# Patient Record
Sex: Male | Born: 1950 | Race: White | Hispanic: No | Marital: Married | Smoking: Former smoker
Health system: Southern US, Community
[De-identification: ages and names within clinical notes are randomized; demographics above are authoritative.]

## PROBLEM LIST (undated history)

## (undated) DIAGNOSIS — G4733 Obstructive sleep apnea (adult) (pediatric): Secondary | ICD-10-CM

## (undated) DIAGNOSIS — D689 Coagulation defect, unspecified: Secondary | ICD-10-CM

## (undated) DIAGNOSIS — M79609 Pain in unspecified limb: Secondary | ICD-10-CM

## (undated) DIAGNOSIS — E785 Hyperlipidemia, unspecified: Secondary | ICD-10-CM

## (undated) DIAGNOSIS — K648 Other hemorrhoids: Secondary | ICD-10-CM

## (undated) DIAGNOSIS — R569 Unspecified convulsions: Secondary | ICD-10-CM

## (undated) DIAGNOSIS — G40109 Localization-related (focal) (partial) symptomatic epilepsy and epileptic syndromes with simple partial seizures, not intractable, without status epilepticus: Secondary | ICD-10-CM

## (undated) DIAGNOSIS — H409 Unspecified glaucoma: Secondary | ICD-10-CM

## (undated) DIAGNOSIS — I639 Cerebral infarction, unspecified: Secondary | ICD-10-CM

## (undated) DIAGNOSIS — I809 Phlebitis and thrombophlebitis of unspecified site: Secondary | ICD-10-CM

## (undated) DIAGNOSIS — K5790 Diverticulosis of intestine, part unspecified, without perforation or abscess without bleeding: Secondary | ICD-10-CM

## (undated) HISTORY — DX: Unspecified glaucoma: H40.9

## (undated) HISTORY — DX: Other hemorrhoids: K64.8

## (undated) HISTORY — DX: Localization-related (focal) (partial) symptomatic epilepsy and epileptic syndromes with simple partial seizures, not intractable, without status epilepticus: G40.109

## (undated) HISTORY — DX: Obstructive sleep apnea (adult) (pediatric): G47.33

## (undated) HISTORY — PX: KNEE ARTHROCENTESIS: SUR44

## (undated) HISTORY — DX: Coagulation defect, unspecified: D68.9

## (undated) HISTORY — PX: CATARACT EXTRACTION: SUR2

## (undated) HISTORY — DX: Hyperlipidemia, unspecified: E78.5

## (undated) HISTORY — DX: Phlebitis and thrombophlebitis of unspecified site: I80.9

## (undated) HISTORY — DX: Diverticulosis of intestine, part unspecified, without perforation or abscess without bleeding: K57.90

## (undated) HISTORY — PX: GLAUCOMA SURGERY: SHX656

## (undated) HISTORY — DX: Pain in unspecified limb: M79.609

---

## 2004-08-13 ENCOUNTER — Ambulatory Visit (HOSPITAL_COMMUNITY): Admission: RE | Admit: 2004-08-13 | Discharge: 2004-08-13 | Payer: Self-pay | Admitting: Orthopedic Surgery

## 2004-09-07 ENCOUNTER — Ambulatory Visit: Payer: Self-pay | Admitting: Oncology

## 2006-03-01 ENCOUNTER — Encounter: Admission: RE | Admit: 2006-03-01 | Discharge: 2006-03-01 | Payer: Self-pay | Admitting: Family Medicine

## 2009-03-09 ENCOUNTER — Encounter: Admission: RE | Admit: 2009-03-09 | Discharge: 2009-03-09 | Payer: Self-pay | Admitting: Internal Medicine

## 2010-12-27 ENCOUNTER — Emergency Department (HOSPITAL_COMMUNITY): Payer: Managed Care, Other (non HMO)

## 2010-12-27 ENCOUNTER — Inpatient Hospital Stay (HOSPITAL_COMMUNITY)
Admission: EM | Admit: 2010-12-27 | Discharge: 2010-12-28 | DRG: 101 | Disposition: A | Discharge status: Home / Self Care | Payer: Managed Care, Other (non HMO) | Attending: Neurology | Admitting: Neurology

## 2010-12-27 DIAGNOSIS — Z7902 Long term (current) use of antithrombotics/antiplatelets: Secondary | ICD-10-CM

## 2010-12-27 DIAGNOSIS — F1010 Alcohol abuse, uncomplicated: Secondary | ICD-10-CM | POA: Diagnosis present

## 2010-12-27 DIAGNOSIS — Z79899 Other long term (current) drug therapy: Secondary | ICD-10-CM

## 2010-12-27 DIAGNOSIS — I10 Essential (primary) hypertension: Secondary | ICD-10-CM | POA: Diagnosis present

## 2010-12-27 DIAGNOSIS — Z7982 Long term (current) use of aspirin: Secondary | ICD-10-CM

## 2010-12-27 DIAGNOSIS — G40802 Other epilepsy, not intractable, without status epilepticus: Principal | ICD-10-CM | POA: Diagnosis present

## 2010-12-27 DIAGNOSIS — H409 Unspecified glaucoma: Secondary | ICD-10-CM | POA: Diagnosis present

## 2010-12-27 DIAGNOSIS — Z8673 Personal history of transient ischemic attack (TIA), and cerebral infarction without residual deficits: Secondary | ICD-10-CM

## 2010-12-27 LAB — COMPREHENSIVE METABOLIC PANEL
Alkaline Phosphatase: 73 U/L (ref 39–117)
BUN: 14 mg/dL (ref 6–23)
Glucose, Bld: 150 mg/dL — ABNORMAL HIGH (ref 70–99)
Potassium: 4.8 mEq/L (ref 3.5–5.1)
Total Protein: 7.7 g/dL (ref 6.0–8.3)

## 2010-12-27 LAB — CBC
HCT: 47.2 % (ref 39.0–52.0)
Hemoglobin: 16.1 g/dL (ref 13.0–17.0)
RBC: 4.87 MIL/uL (ref 4.22–5.81)
RDW: 12.6 % (ref 11.5–15.5)
WBC: 7.6 10*3/uL (ref 4.0–10.5)

## 2010-12-27 LAB — URINE MICROSCOPIC-ADD ON

## 2010-12-27 LAB — DIFFERENTIAL
Eosinophils Relative: 2 % (ref 0–5)
Lymphocytes Relative: 40 % (ref 12–46)
Monocytes Relative: 11 % (ref 3–12)
Neutrophils Relative %: 46 % (ref 43–77)

## 2010-12-27 LAB — URINALYSIS, ROUTINE W REFLEX MICROSCOPIC
Bilirubin Urine: NEGATIVE
Glucose, UA: NEGATIVE mg/dL
Ketones, ur: NEGATIVE mg/dL
Leukocytes, UA: NEGATIVE
Nitrite: NEGATIVE
Protein, ur: 30 mg/dL — AB
Specific Gravity, Urine: 1.017 (ref 1.005–1.030)
Urobilinogen, UA: 0.2 mg/dL (ref 0.0–1.0)
pH: 5 (ref 5.0–8.0)

## 2010-12-27 LAB — PHENYTOIN LEVEL, TOTAL: Phenytoin Lvl: 10.9 ug/mL (ref 10.0–20.0)

## 2010-12-27 LAB — CK TOTAL AND CKMB (NOT AT ARMC)
CK, MB: 2.5 ng/mL (ref 0.3–4.0)
Relative Index: 1.7 (ref 0.0–2.5)
Total CK: 148 U/L (ref 7–232)

## 2010-12-27 LAB — PROTIME-INR
INR: 0.99 (ref 0.00–1.49)
Prothrombin Time: 13.3 seconds (ref 11.6–15.2)

## 2010-12-27 LAB — APTT: aPTT: 26 seconds (ref 24–37)

## 2010-12-27 LAB — MRSA PCR SCREENING: MRSA by PCR: NEGATIVE

## 2010-12-27 MED ORDER — GADOBENATE DIMEGLUMINE 529 MG/ML IV SOLN
20.0000 mL | Freq: Once | INTRAVENOUS | Status: AC
  Administered 2010-12-27: 20 mL via INTRAVENOUS

## 2010-12-28 ENCOUNTER — Other Ambulatory Visit (HOSPITAL_COMMUNITY): Payer: Managed Care, Other (non HMO)

## 2010-12-28 LAB — LIPID PANEL
Cholesterol: 137 mg/dL (ref 0–200)
HDL: 50 mg/dL (ref >39)
Total CHOL/HDL Ratio: 2.7 RATIO
Triglycerides: 74 mg/dL (ref <150)
VLDL: 15 mg/dL (ref 0–40)

## 2010-12-28 LAB — URINE CULTURE

## 2011-01-07 NOTE — H&P (Signed)
NAMEJANDEL, Jason Preston NO.:  [id number]  MEDICAL RECORD NO.:  [id number]           PATIENT TYPE:  I  LOCATION:  3111                         FACILITY:  [facility]  PHYSICIAN:  Thana Farr, MD    DATE OF BIRTH:  02-04-1951  DATE OF ADMISSION:  12/27/2010 DATE OF DISCHARGE:                             HISTORY & PHYSICAL   ADMISSION DIAGNOSIS:  Status epilepticus.  HISTORY:  Jason Preston is a 60 year old male that was recently discharged from the hospital in April with after having had a stroke and new-onset seizure.  Antiepileptics were not started at that time.  The patient did not have any deficit.  He was playing tennis today and was noted to have a full focal seizure with head twitching to the left.  The patient walked off of the court and had another set of seizure.  After he was noted to have left-sided weakness.  EMS was called and during the EMS arrived, the patient had a what was felt to be a generalized tonic- clonic seizure.  During evaluation here in the ED, the patient had another witnessed seizure.  Head and eyes turned to the left and then the patient generalized again.  Code Stroke was called initially due to the left-sided weakness.  Code Stroke was stopped at this point and the patient continued to be reevaluated for seizures.  PAST MEDICAL HISTORY:  Stroke in the past, hypertension and glaucoma.  MEDICATIONS:  Aspirin.  SOCIAL HISTORY:  The patient is married.  He does have a history of binge drinking, has recently been asked to cut down to 2 beers on occasion.  Last drink was this weekend.  He has no history of tobacco or illicit drug abuse.  PHYSICAL EXAMINATION:  VITAL SIGNS:  Blood pressure 147/82, heart rate 77, respiratory rate 14, temperature 97.5, O2 sat 96% on 4 L nasal cannula. LUNGS:  Clear to auscultation. CARDIOVASCULAR:  No single S1 and S2.  No edema is noted. ABDOMEN:  There are positive bowel sounds. NEUROLOGIC:  On mental  status testing, the patient is postictal and sedative from Ativan.  He is agitated and requiring restraints.  He does not follow commands.  On cranial nerve testing, pupils are reactive bilaterally.  The left pupil is 4 mm and irregular.  Right pupil is 2 mm.  There is a decreased right corneal.  Left corneal is brisk.  On motor exam, the patient moves all extremities strongly.  Sensory testing, the patient does not respond to a noxious stimuli . EXTREMITIES:  Deep tendon reflexes are 2+.  Plantars are upgoing bilaterally.  Cerebellar testing could not be performed.  Laboratory data shows a white blood cell count of 7.6, platelet count of 193, hemoglobin and hematocrit 16.1 and 47.2 respectively.  PT 13.3, INR was 0.99, PTT 26.  CT shows a right temporal encephalomalacia.  There is a remote left parietal infarct as well.  No acute changes are noted.  ASSESSMENT:  Jason Preston is a 60 year old male with a history of stroke and seizure.  Now with recurrent seizure and has had 4 today.  His seizures do  seem to be related to the right hemisphere and may very well be related to this area of encephalomalacia that is noted in the right temporal lobe.  The patient will require antiepileptics at this time.  PLAN: 1. The patient will be loaded with Dilantin 1 g.  Level to be checked     in 2 hours.  Maintenance Dilantin be started 100 mg IV q.8 h. 2. EEG. 3. MRI of the brain. 4. Once able to take p.o., we will start Plavix.  The patient was     previously to be on Plavix but gave resistance in starting the     medication.  The patient has had a recent stroke workup including     echo and carotids that was unremarkable.  Will not repeat at this time.          ______________________________ Thana Farr, MD     LR/MEDQ  D:  12/27/2010  T:  12/27/2010  Job:  [id number]  Electronically Signed by Thana Farr MD on 01/07/2011 12:36:23 PM

## 2011-01-07 NOTE — Discharge Summary (Signed)
NAMETREK, KIMBALL NO.:  [id number]  MEDICAL RECORD NO.:  [id number]           PATIENT TYPE:  I  LOCATION:  3111                         FACILITY:  [facility]  PHYSICIAN:  Thana Farr, MD    DATE OF BIRTH:  1950-08-29  DATE OF ADMISSION:  12/27/2010 DATE OF DISCHARGE:  12/28/2010                              DISCHARGE SUMMARY   ADMITTING DIAGNOSIS:  Status epilepticus.  DISCHARGE DIAGNOSIS:  Status epilepticus.  HISTORY OF PRESENT ILLNESS:  This is a pleasant 60 year old male that was recently discharged from the hospital in April after having had a stroke along with a new onset seizure.  Due to the seizure being a new onset seizure, no antiepileptics were started at that time.  On Dec 27, 2010, the patient was playing tennis with a friend when he was noted to have a full focal seizure with head twitching and turning to the left. Apparently, the patient had walked off the tennis court and had a second seizure.  The patient was brought to the [facility] where the patient was witnessed to have a third tonic-clonic seizure.  The patient was seen by Dr. Thad Ranger at that time and started on Dilantin.  The patient has remained stable during hospitalization.  He was brought to the Acute Care Floor 3100 where he was started on IV Dilantin and has not had any seizure activity since being admitted in the emergency department.  At the present time, the patient is fully alert and oriented, following all commands, shows no postictal state, has been tolerating Dilantin without any complications, and is taken p.o. medications.  PAST MEDICAL HISTORY: 1. Stroke in April 2012. 2. Hypertension. 3. Glaucoma.  ADMISSION MEDICATIONS:  Aspirin, Plavix, pravastatin, and metoprolol.  DISCHARGE MEDICATIONS: 1. Dilantin 100 mg 3 capsules by mouth at bedtime. 2. Aspirin 81 mg 1 tablet by mouth daily. 3. Metoprolol XL 25 mg 1 tablet by mouth daily. 4. Plavix 75 mg 1  tablet by mouth daily. 5. Pravastatin 20 mg 1 tablet by mouth daily. 6. Propranolol 10 mg 1 tablet by mouth daily.  ALLERGIES:  No known drug allergies.  DISCHARGE PHYSICAL EXAMINATION:  GENERAL:  The patient is alert and oriented x3, carries out 2-3 steps commands without difficulty. HEENT:  Pupils equal, round, and reactive to light.  Extraocular movements are intact.  Tongue is midline.  Face is equal and symmetrical. NEUROLOGIC:  Facial sensation is intact to pinprick, light touch, and vibration throughout.  Shoulder shrug and head turn is within normal limits.  Vision is intact to double simultaneous stimuli.  The patient moves all extremities with 5/5 strength.  Deep tendon reflexes 2+. Sensation is grossly intact to pinprick and light touch in the upper and lower extremities.  The patient shows no drift in the upper and lower extremities.  Fine finger moments and heel-to-shin were smooth.  DIAGNOSTIC IMAGING DURING HOSPITALIZATION:  MRI of brain showed no acute infarct, moderate-to-large right temporal lobe infarct extending towards the right occipital lobe with encephalomalacia.  CT of head showed old infarct in the right temporal lobe with mild extension into the parietal  and occipital regions.  Small remote infarct in the left parietal lobe. No other acute intracranial findings.  Chest x-ray shows cardiomegaly without edema.  No airspace opacities identified.  ASSESSMENT:  This is a pleasant 60 year old male with history of stroke, now with status epilepticus.  The patient had a total of 4 seizures on Dec 27, 2010 and has been stable since.  His seizures seemed to be related to the right hemisphere and may very well to the area of encephalomalacia that was noted in the right temporal lobe.  The patient will be started on Dilantin as an antiepileptic and remain on this medication until followup with Dr. Sandria Manly which he has an appointment in 1 week.  We have instructed the  patient to keep his appointment with Dr. Sandria Manly.  DISCHARGE INSTRUCTIONS: 1. Follow up with Dr. Sandria Manly in approximately 1 week with his already     scheduled appointment. 2. I have written prescription for Dilantin 300 mg to be taken at     bedtime.  The patient is to take this medication every night prior     to bed until he follows up with Dr. Sandria Manly.  At that time,     medications may be changed.  We have chosen Dilantin as the patient     has made it very clear that he prefers not to be on any medications     for seizures, although he clearly needs to be on a medication at     this time.  Dilantin is a cheap, well-known drug and in addition,     we can obtain a level to make sure he is one compliant and two has     enough medication in his system if the patient were to have a     second seizure. 3. We have had a long discussion with the patient that he is to drive     no vehicle, that he is to climb no heights, and in addition he is     to not be in standing water for at least 6 months or until he is     cleared by Dr. Sandria Manly.  The patient has agreed and states that he     understands all these instructions.  He has noted to me that he is     still going to ride his bike as he has recently bought a helmet.  I     have told him the dangers of riding a bike, as if he falls he could     injure himself.  The patient will be discharged home today on all     his home medications with the addition of the Dilantin as mentioned     above.     Felicie Morn, PA-C   ______________________________ Thana Farr, MD    DS/MEDQ  D:  12/28/2010  T:  12/28/2010  Job:  [id number]  cc:   Genene Churn. Love, M.D.  Electronically Signed by Felicie Morn PA-C on 12/29/2010 08:21:47 AM Electronically Signed by Thana Farr MD on 01/07/2011 12:38:04 PM

## 2011-04-21 ENCOUNTER — Emergency Department (HOSPITAL_COMMUNITY)
Admission: EM | Admit: 2011-04-21 | Discharge: 2011-04-21 | Disposition: A | Discharge status: Home / Self Care | Payer: Managed Care, Other (non HMO) | Attending: Emergency Medicine | Admitting: Emergency Medicine

## 2011-04-21 DIAGNOSIS — H409 Unspecified glaucoma: Secondary | ICD-10-CM | POA: Insufficient documentation

## 2011-04-21 DIAGNOSIS — R32 Unspecified urinary incontinence: Secondary | ICD-10-CM | POA: Insufficient documentation

## 2011-04-21 DIAGNOSIS — Z79899 Other long term (current) drug therapy: Secondary | ICD-10-CM | POA: Insufficient documentation

## 2011-04-21 DIAGNOSIS — Z8673 Personal history of transient ischemic attack (TIA), and cerebral infarction without residual deficits: Secondary | ICD-10-CM | POA: Insufficient documentation

## 2011-04-21 DIAGNOSIS — Z7982 Long term (current) use of aspirin: Secondary | ICD-10-CM | POA: Insufficient documentation

## 2011-04-21 DIAGNOSIS — G40909 Epilepsy, unspecified, not intractable, without status epilepticus: Secondary | ICD-10-CM | POA: Insufficient documentation

## 2011-04-21 DIAGNOSIS — E785 Hyperlipidemia, unspecified: Secondary | ICD-10-CM | POA: Insufficient documentation

## 2011-04-21 LAB — BASIC METABOLIC PANEL
BUN: 15 mg/dL (ref 6–23)
CO2: 24 mEq/L (ref 19–32)
GFR calc non Af Amer: >60 mL/min (ref >60)
Glucose, Bld: 103 mg/dL — ABNORMAL HIGH (ref 70–99)
Potassium: 3.9 mEq/L (ref 3.5–5.1)

## 2011-04-21 LAB — GLUCOSE, CAPILLARY: Glucose-Capillary: 109 mg/dL — ABNORMAL HIGH (ref 70–99)

## 2011-08-14 ENCOUNTER — Encounter (HOSPITAL_COMMUNITY): Payer: Self-pay

## 2011-08-14 ENCOUNTER — Emergency Department (HOSPITAL_COMMUNITY)
Admission: EM | Admit: 2011-08-14 | Discharge: 2011-08-14 | Disposition: A | Discharge status: Home / Self Care | Payer: Managed Care, Other (non HMO) | Attending: Emergency Medicine | Admitting: Emergency Medicine

## 2011-08-14 ENCOUNTER — Emergency Department (HOSPITAL_COMMUNITY): Payer: Managed Care, Other (non HMO)

## 2011-08-14 ENCOUNTER — Other Ambulatory Visit: Payer: Self-pay

## 2011-08-14 DIAGNOSIS — Z79899 Other long term (current) drug therapy: Secondary | ICD-10-CM | POA: Insufficient documentation

## 2011-08-14 DIAGNOSIS — T50905A Adverse effect of unspecified drugs, medicaments and biological substances, initial encounter: Secondary | ICD-10-CM

## 2011-08-14 DIAGNOSIS — H9319 Tinnitus, unspecified ear: Secondary | ICD-10-CM | POA: Insufficient documentation

## 2011-08-14 DIAGNOSIS — R4182 Altered mental status, unspecified: Secondary | ICD-10-CM | POA: Insufficient documentation

## 2011-08-14 DIAGNOSIS — R569 Unspecified convulsions: Secondary | ICD-10-CM | POA: Insufficient documentation

## 2011-08-14 DIAGNOSIS — R209 Unspecified disturbances of skin sensation: Secondary | ICD-10-CM | POA: Insufficient documentation

## 2011-08-14 DIAGNOSIS — Z7982 Long term (current) use of aspirin: Secondary | ICD-10-CM | POA: Insufficient documentation

## 2011-08-14 DIAGNOSIS — T4275XA Adverse effect of unspecified antiepileptic and sedative-hypnotic drugs, initial encounter: Secondary | ICD-10-CM | POA: Insufficient documentation

## 2011-08-14 DIAGNOSIS — IMO0001 Reserved for inherently not codable concepts without codable children: Secondary | ICD-10-CM

## 2011-08-14 HISTORY — DX: Cerebral infarction, unspecified: I63.9

## 2011-08-14 HISTORY — DX: Unspecified convulsions: R56.9

## 2011-08-14 LAB — COMPREHENSIVE METABOLIC PANEL
ALT: 36 U/L (ref 0–53)
Alkaline Phosphatase: 87 U/L (ref 39–117)
BUN: 13 mg/dL (ref 6–23)
CO2: 28 mEq/L (ref 19–32)
Chloride: 104 mEq/L (ref 96–112)
GFR calc Af Amer: >90 mL/min (ref >90)
GFR calc non Af Amer: >90 mL/min (ref >90)
Glucose, Bld: 100 mg/dL — ABNORMAL HIGH (ref 70–99)
Potassium: 4 mEq/L (ref 3.5–5.1)
Total Bilirubin: 0.3 mg/dL (ref 0.3–1.2)
Total Protein: 7.3 g/dL (ref 6.0–8.3)

## 2011-08-14 LAB — URINALYSIS, ROUTINE W REFLEX MICROSCOPIC
Bilirubin Urine: NEGATIVE
Ketones, ur: NEGATIVE mg/dL
Leukocytes, UA: NEGATIVE
Nitrite: NEGATIVE
Protein, ur: NEGATIVE mg/dL

## 2011-08-14 LAB — CBC
MCHC: 35.2 g/dL (ref 30.0–36.0)
Platelets: 174 10*3/uL (ref 150–400)
RDW: 13 % (ref 11.5–15.5)
WBC: 4.8 10*3/uL (ref 4.0–10.5)

## 2011-08-14 LAB — PROTIME-INR
INR: 0.96 (ref 0.00–1.49)
Prothrombin Time: 13 seconds (ref 11.6–15.2)

## 2011-08-14 LAB — CARDIAC PANEL(CRET KIN+CKTOT+MB+TROPI): Troponin I: <0.3 ng/mL (ref <0.30)

## 2011-08-14 MED ORDER — LORAZEPAM 2 MG/ML IJ SOLN
1.0000 mg | Freq: Once | INTRAMUSCULAR | Status: AC
  Administered 2011-08-14: 1 mg via INTRAVENOUS
  Filled 2011-08-14: qty 1

## 2011-08-14 MED ORDER — LORAZEPAM 2 MG/ML IJ SOLN
INTRAMUSCULAR | Status: AC
  Filled 2011-08-14: qty 1

## 2011-08-14 MED ORDER — LORAZEPAM 2 MG/ML IJ SOLN
0.5000 mg | Freq: Once | INTRAMUSCULAR | Status: AC
  Administered 2011-08-14: 0.5 mg via INTRAVENOUS

## 2011-08-14 MED ORDER — SODIUM CHLORIDE 0.9 % IV SOLN
INTRAVENOUS | Status: DC
  Administered 2011-08-14: 12:00:00 via INTRAVENOUS

## 2011-08-14 NOTE — ED Notes (Signed)
Report called to [facility]. Pt transported to CDU. Pt remains alertx3 resp easy

## 2011-08-14 NOTE — ED Notes (Signed)
Patient is resting comfortably. 

## 2011-08-14 NOTE — ED Notes (Signed)
Patient returned from MRI. Wife has returned to see pt.

## 2011-08-14 NOTE — ED Provider Notes (Addendum)
History     CSN: [id number]  Arrival date & time 08/14/11  1033   First MD Initiated Contact with Patient 08/14/11 1052      Chief Complaint  Patient presents with  . Altered Mental Status    (Consider location/radiation/quality/duration/timing/severity/associated sxs/prior treatment) The history is provided by the patient.  pt c/o left hand and left foot numbness/tingling sensation since yesterday, worse when got up this morning. States hx cva 10/2010, states affected left side but only residual affects were some tingling of fingers in left hand. Since cva, has had 3-4 seizures, spouse notes in periods after seizures, pt will notice some return of or increased left hand numbness. Yesterday was at beach, was on treadmill, had seizure. Unsure how long lasted. Was seen at hospital there, and states was started on keppra. Last evening noted 'delusions', thinking that ears were on backwards, pants were on backwards. This morning awoke and notes increased left hand and foot numbness, and sense of ringing in left ear. No hearing loss or ear pain. No headache. No neck pain. No weakness. Denies loss of control or dexterity. No problems w balance or coordination. No problems w vision or speech. Regarding symptoms, denies specific exacerbating or alleviating factors. Constant.   No past medical history on file.  No past surgical history on file.  No family history on file.  History  Substance Use Topics  . Smoking status: Not on file  . Smokeless tobacco: Not on file  . Alcohol Use: Not on file      Review of Systems  Constitutional: Negative for fever.  HENT: Negative for neck pain and neck stiffness.   Eyes: Negative for redness and visual disturbance.  Respiratory: Negative for shortness of breath.   Cardiovascular: Negative for chest pain and leg swelling.  Gastrointestinal: Negative for abdominal pain.  Genitourinary: Negative for flank pain.  Musculoskeletal: Negative for back  pain and gait problem.  Skin: Negative for rash.  Neurological: Negative for weakness and headaches.  Hematological: Does not bruise/bleed easily.  Psychiatric/Behavioral: Negative for hallucinations.    Allergies  Review of patient's allergies indicates no known allergies.  Home Medications   Current Outpatient Rx  Name Route Sig Dispense Refill  . ASPIRIN EC 81 MG PO TBEC Oral Take 81 mg by mouth at bedtime.    . CLOPIDOGREL BISULFATE 75 MG PO TABS Oral Take 75 mg by mouth daily.    Marland Kitchen METOPROLOL SUCCINATE ER 25 MG PO TB24 Oral Take 25 mg by mouth daily.    Marland Kitchen PHENYTOIN SODIUM EXTENDED 100 MG PO CAPS Oral Take 200-300 mg by mouth 2 (two) times daily. 200 mg in the morning and 300 mg at night    . PRAVASTATIN SODIUM 20 MG PO TABS Oral Take 20 mg by mouth at bedtime.      BP 139/94  Pulse 63  Temp(Src) 98.1 F (36.7 C) (Oral)  Resp 13  SpO2 100%  Physical Exam  Nursing note and vitals reviewed. Constitutional: He is oriented to person, place, and time. He appears well-developed and well-nourished. No distress.  HENT:  Head: Atraumatic.  Eyes: Pupils are equal, round, and reactive to light.  Neck: Neck supple. No tracheal deviation present.       No bruit  Cardiovascular: Normal rate, regular rhythm, normal heart sounds and intact distal pulses.  Exam reveals no gallop and no friction rub.   No murmur heard. Pulmonary/Chest: Effort normal and breath sounds normal. No accessory muscle usage. No respiratory distress.  Abdominal: Soft. He exhibits no distension. There is no tenderness.  Musculoskeletal: Normal range of motion. He exhibits no edema and no tenderness.  Neurological: He is alert and oriented to person, place, and time.       No facial droop or pronator drift. Motor intact bil.   Skin: Skin is warm and dry.  Psychiatric: He has a normal mood and affect.    ED Course  Procedures (including critical care time)   Results for orders placed during the hospital  encounter of 08/14/11  COMPREHENSIVE METABOLIC PANEL      Component Value Range   Sodium 140  135 - 145 (mEq/L)   Potassium 4.0  3.5 - 5.1 (mEq/L)   Chloride 104  96 - 112 (mEq/L)   CO2 28  19 - 32 (mEq/L)   Glucose, Bld 100 (*) 70 - 99 (mg/dL)   BUN 13  6 - 23 (mg/dL)   Creatinine, Ser 1.61  0.50 - 1.35 (mg/dL)   Calcium 9.2  8.4 - 09.6 (mg/dL)   Total Protein 7.3  6.0 - 8.3 (g/dL)   Albumin 3.8  3.5 - 5.2 (g/dL)   AST 28  0 - 37 (U/L)   ALT 36  0 - 53 (U/L)   Alkaline Phosphatase 87  39 - 117 (U/L)   Total Bilirubin 0.3  0.3 - 1.2 (mg/dL)   GFR calc non Af Amer >90  >90 (mL/min)   GFR calc Af Amer >90  >90 (mL/min)  CBC      Component Value Range   WBC 4.8  4.0 - 10.5 (K/uL)   RBC 4.84  4.22 - 5.81 (MIL/uL)   Hemoglobin 16.2  13.0 - 17.0 (g/dL)   HCT 04.5  40.9 - 81.1 (%)   MCV 95.0  78.0 - 100.0 (fL)   MCH 33.5  26.0 - 34.0 (pg)   MCHC 35.2  30.0 - 36.0 (g/dL)   RDW 91.4  78.2 - 95.6 (%)   Platelets 174  150 - 400 (K/uL)  PROTIME-INR      Component Value Range   Prothrombin Time 13.0  11.6 - 15.2 (seconds)   INR 0.96  0.00 - 1.49   URINALYSIS, ROUTINE W REFLEX MICROSCOPIC      Component Value Range   Color, Urine YELLOW  YELLOW    APPearance CLEAR  CLEAR    Specific Gravity, Urine 1.022  1.005 - 1.030    pH 6.0  5.0 - 8.0    Glucose, UA NEGATIVE  NEGATIVE (mg/dL)   Hgb urine dipstick NEGATIVE  NEGATIVE    Bilirubin Urine NEGATIVE  NEGATIVE    Ketones, ur NEGATIVE  NEGATIVE (mg/dL)   Protein, ur NEGATIVE  NEGATIVE (mg/dL)   Urobilinogen, UA 0.2  0.0 - 1.0 (mg/dL)   Nitrite NEGATIVE  NEGATIVE    Leukocytes, UA NEGATIVE  NEGATIVE   CARDIAC PANEL(CRET KIN+CKTOT+MB+TROPI)      Component Value Range   Total CK 62  7 - 232 (U/L)   CK, MB 1.7  0.3 - 4.0 (ng/mL)   Troponin I <0.30  <0.30 (ng/mL)   Relative Index RELATIVE INDEX IS INVALID  0.0 - 2.5   PHENYTOIN LEVEL, TOTAL      Component Value Range   Phenytoin Lvl 15.0  10.0 - 20.0 (ug/mL)   Ct Head Wo  Contrast  08/14/2011  *RADIOLOGY REPORT*  Clinical Data: 61 year old male with seizure.  History of stroke.  CT HEAD WITHOUT CONTRAST  Technique:  Contiguous axial images were  obtained from the base of the skull through the vertex without contrast.  Comparison: Brain MRI 12/27/2010, head CT 12/27/2010.  Findings: Visualized paranasal sinuses and mastoids are clear.  No acute orbit or scalp soft tissue findings. No acute osseous abnormality identified.  Chronic right temporal lobe encephalomalacia is not significantly changed.  Left superior parietal lobe cortical encephalomalacia is stable.  Stable gray-white matter differentiation elsewhere.  No ventriculomegaly. No midline shift, mass effect, or evidence of mass lesion.  No acute intracranial hemorrhage identified.  No suspicious intracranial vascular hyperdensity.  IMPRESSION: 1. No acute intracranial abnormality. 2.  Small chronic infarct in the left parietal lobe and right temporal lobe encephalomalacia are stable.  Original Report Authenticated By: Harley Hallmark, M.D.      MDM  Iv ns. Ct. Labs.  Pt anxious, shaky. No sz activity. Ativan 1 mg iv.  Reviewed nursing notes and prior charts.   Date: 08/14/2011  Rate: 61  Rhythm: normal sinus rhythm  QRS Axis: normal  Intervals: normal  ST/T Wave abnormalities: nonspecific ST changes  Conduction Disutrbances:none  Narrative Interpretation:   Old EKG Reviewed: unchanged   Discussed w cdu pa, tran, plan to check mri result when back, if mri neg and symptoms resolved/at baseline, may f/u with his neurologist, dr love, in the next couple days for recheck.       Suzi Roots, MD 08/14/11 1118  Suzi Roots, MD 08/14/11 [phone]

## 2011-08-14 NOTE — ED Provider Notes (Signed)
3:24 PM Patient is in CDU holding for MRI brain.  Patient with hx stroke with residual left sided paresthesia.  Yesterday had a seizure and has since had increased paresthesia on the left side.  States it was previously just is his fingertips and now involves more of his hand.  States that he has not had any progression of these symptoms while he has been in the ED.  MRI tech is now here to take patient for MRI.  Ativan ordered for patient's concern about claustrophobia.  Will continue to follow.    Discussed results with patient and patient's wife.  Patient and wife are both reassured.  They are concerned that patient's symptoms and odd behavior may have been caused by Keppra patient was placed on by an out of town emergency department.  They will discuss this with Dr Sandria Manly tomorrow.  Patient has none of these symptoms at this time and is happy to be discharged home. Plan is for d/c home with close follow up with Dr Sandria Manly, patient's neurologist.  Rise Patience, [place] 08/14/11 2222

## 2011-08-14 NOTE — ED Notes (Signed)
MD at bedside. 

## 2011-08-14 NOTE — ED Notes (Signed)
Pt's wife, Olegario Messier had to go home for a moment and her cell phone number is [phone].

## 2011-08-14 NOTE — ED Notes (Signed)
Pt states feeling much better. Seizure precautions maintained. No signs of seizure at this time

## 2011-08-14 NOTE — ED Notes (Signed)
Family at bedside. 

## 2011-08-14 NOTE — ED Notes (Signed)
[place], [place], advised transporter arrived to transport pt to MRI and pt needs Ativan 0.5mg  given IVP - given.

## 2011-08-14 NOTE — ED Notes (Signed)
Hd x 4 seizures since stroke in April. Taking dilantin, while at [facility] had a seizure and prescribed levetiracetam, and started having delusions on Saturday - "pants on backwards, ears on backwards." stopped taking drug sat. Morning and delusions improved. And states, "inc. Tingling in lt. Arm." no other neuro deficits. Seizure: starts starring, drooling, and tremors on lt. Side;

## 2011-08-14 NOTE — ED Notes (Signed)
Pt states discharged from [facility] yesterday for seizure.  Today pt c/o hallucinations of "covers being pulled off my bed" pt states he feels like his ears are on backwards and shoes are on backwards. Pt tearful and anxious. Emotional reassurance offered wife at bedside. Dr Arizona Constable called to bedside on arrival. Monitors intact with continuous  monitoring.

## 2011-08-14 NOTE — ED Notes (Signed)
Patient transported to MRI 

## 2011-08-15 NOTE — ED Provider Notes (Signed)
Medical screening examination/treatment/procedure(s) were conducted as a shared visit with non-physician practitioner(s) and myself.  I personally evaluated the patient during the encounter  See edp noted  Suzi Roots, MD 08/15/11 1046

## 2011-10-04 ENCOUNTER — Encounter: Payer: Managed Care, Other (non HMO) | Admitting: Gastroenterology

## 2011-10-18 ENCOUNTER — Encounter: Payer: Managed Care, Other (non HMO) | Admitting: Gastroenterology

## 2011-11-14 ENCOUNTER — Ambulatory Visit: Payer: Managed Care, Other (non HMO) | Admitting: Gastroenterology

## 2011-11-28 ENCOUNTER — Encounter: Payer: Self-pay | Admitting: Gastroenterology

## 2011-12-05 ENCOUNTER — Ambulatory Visit (INDEPENDENT_AMBULATORY_CARE_PROVIDER_SITE_OTHER): Payer: Managed Care, Other (non HMO) | Admitting: Gastroenterology

## 2011-12-05 ENCOUNTER — Encounter: Payer: Self-pay | Admitting: Gastroenterology

## 2011-12-05 VITALS — BP 120/76 | HR 60 | Ht 72.0 in | Wt 204.2 lb

## 2011-12-05 DIAGNOSIS — Z1211 Encounter for screening for malignant neoplasm of colon: Secondary | ICD-10-CM

## 2011-12-05 DIAGNOSIS — K5900 Constipation, unspecified: Secondary | ICD-10-CM

## 2011-12-05 MED ORDER — MOVIPREP 100 G PO SOLR: 1.0000 | Freq: Once | ORAL | Status: DC

## 2011-12-05 NOTE — Progress Notes (Signed)
History of Present Illness: This is a 61 year old male here today with his wife. He has a history of a stroke with a seizure in April 2012 and he has been maintained on Plavix since that time. He has occasional, mild constipation recently treated with several days of MiraLax. He is due for screening colonoscopy. Last colonoscopy was in March 2003 showing diverticulosis and hemorrhoids. Denies weight loss, abdominal pain, diarrhea, change in stool caliber, melena, hematochezia, nausea, vomiting, dysphagia, reflux symptoms, chest pain.  No Known Allergies Outpatient Prescriptions Prior to Visit  Medication Sig Dispense Refill  . aspirin EC 81 MG tablet Take 81 mg by mouth at bedtime.      . clopidogrel (PLAVIX) 75 MG tablet Take 75 mg by mouth daily.      . phenytoin (DILANTIN) 100 MG ER capsule Take 200-300 mg by mouth 2 (two) times daily. 200 mg in the morning and 300 mg at night      . pravastatin (PRAVACHOL) 20 MG tablet Take 20 mg by mouth at bedtime.      . metoprolol succinate (TOPROL-XL) 25 MG 24 hr tablet Take 25 mg by mouth daily.       Past Medical History  Diagnosis Date  . Seizures   . Stroke   . Diverticulosis   . Internal hemorrhoids    Past Surgical History  Procedure Date  . Knee arthrocentesis     bilateral  . Cataract extraction   . Glaucoma surgery    History   Social History  . Marital Status: Married    Spouse Name: N/A    Number of Children: 3  . Years of Education: N/A   Occupational History  . Disabled    Social History Main Topics  . Smoking status: Former Smoker    Quit date: 08/02/1975  . Smokeless tobacco: Never Used  . Alcohol Use: No  . Drug Use: No  . Sexually Active: None   Other Topics Concern  . None   Social History Narrative  . None   Family History  Problem Relation Age of Onset  . Stomach cancer Father   . Breast cancer Sister     Review of Systems: Pertinent positive and negative review of systems were noted in the above  HPI section. All other review of systems were otherwise negative.   Physical Exam: General: Well developed , well nourished, no acute distress Head: Normocephalic and atraumatic Eyes:  sclerae anicteric, EOMI Ears: Normal auditory acuity Mouth: No deformity or lesions Neck: Supple, no masses or thyromegaly Lungs: Clear throughout to auscultation Heart: Regular rate and rhythm; no murmurs, rubs or bruits Abdomen: Soft, non tender and non distended. No masses, hepatosplenomegaly or hernias noted. Normal Bowel sounds Rectal: Deferred to colonoscopy Musculoskeletal: Symmetrical with no gross deformities  Skin: No lesions on visible extremities Pulses:  Normal pulses noted Extremities: No clubbing, cyanosis, edema or deformities noted Neurological: Alert oriented x 4, grossly nonfocal Cervical Nodes:  No significant cervical adenopathy Inguinal Nodes: No significant inguinal adenopathy Psychological:  Alert and cooperative. Normal mood and affect  Assessment and Recommendations:  1. Colorectal cancer screening, average risk. The risks benefits and alternatives to a 5 day hold of Plavix were discussed with the patient and he consents to proceed. Obtain clearance from Dr. Ivery Quale. The risks, benefits, and alternatives to colonoscopy with possible biopsy and possible polypectomy were discussed with the patient and they consent to proceed.   2. Mild constipation. Increase dietary fiber and water intake. MiraLax  as needed.

## 2011-12-05 NOTE — Patient Instructions (Signed)
You have been scheduled for a colonoscopy with propofol. Please follow written instructions given to you at your visit today.  Please pick up your prep kit at the pharmacy within the next 1-3 days. cc: Jarome Matin, MD

## 2011-12-14 ENCOUNTER — Telehealth: Payer: Self-pay

## 2011-12-14 NOTE — Telephone Encounter (Signed)
Notified patient to come off Plavix x 5 days per Dr. Eloise Harman. Pt agreed and verbalized understanding. Letter scanned into Epic.

## 2011-12-20 ENCOUNTER — Encounter: Payer: Self-pay | Admitting: Gastroenterology

## 2011-12-21 ENCOUNTER — Encounter: Payer: Self-pay | Admitting: Gastroenterology

## 2011-12-21 ENCOUNTER — Ambulatory Visit (AMBULATORY_SURGERY_CENTER): Payer: Managed Care, Other (non HMO) | Admitting: Gastroenterology

## 2011-12-21 VITALS — BP 129/79 | HR 66 | Temp 98.6°F | Resp 18 | Ht 72.0 in | Wt 204.0 lb

## 2011-12-21 DIAGNOSIS — Z1211 Encounter for screening for malignant neoplasm of colon: Secondary | ICD-10-CM

## 2011-12-21 DIAGNOSIS — D126 Benign neoplasm of colon, unspecified: Secondary | ICD-10-CM

## 2011-12-21 MED ORDER — SODIUM CHLORIDE 0.9 % IV SOLN: 500.0000 mL | INTRAVENOUS | Status: DC

## 2011-12-21 NOTE — Op Note (Signed)
[facility] [address] [name], [place]  [address]  COLONOSCOPY PROCEDURE REPORT PATIENT:  Jason Preston, Jason Preston  MR#:  [id number] BIRTHDATE:  Jan 29, 1951, 61 yrs. old  GENDER:  male ENDOSCOPIST:  Judie Petit T. Russella Dar, MD, [facility]  PROCEDURE DATE:  12/21/2011 PROCEDURE:  Colonoscopy with snare polypectomy ASA CLASS:  Class II INDICATIONS:  1) Routine Risk Screening MEDICATIONS:   MAC sedation, administered by CRNA, propofol (Diprivan) 300 mg IV DESCRIPTION OF PROCEDURE:   After the risks benefits and alternatives of the procedure were thoroughly explained, informed consent was obtained.  Digital rectal exam was performed and revealed no abnormalities.   The LB CF-[id number] endoscope was introduced through the anus and advanced to the cecum, which was identified by both the appendix and ileocecal valve, without limitations.  The quality of the prep was adequate, using MoviPrep.  The instrument was then slowly withdrawn as the colon was fully examined. <<PROCEDUREIMAGES>> FINDINGS:  A pedunculated polyp was found in the sigmoid colon. It was 15 mm in size. Polyp was snared, then cauterized with monopolar cautery. Retrieval was successful.  Moderate diverticulosis was found in the sigmoid to descending colon. Otherwise normal colonoscopy without other polyps, masses, vascular ectasias, or inflammatory changes.  Retroflexed views in the rectum revealed internal hemorrhoids, small.  The time to cecum = 3.5  minutes. The scope was then withdrawn (time =  12.5  min) from the patient and the procedure completed.  COMPLICATIONS:  None  ENDOSCOPIC IMPRESSION: 1) 15 mm pedunculated polyp in the sigmoid colon 2) Moderate diverticulosis in the sigmoid to descending colon 3) Internal hemorrhoids  RECOMMENDATIONS: 1) Hold aspirin, aspirin products, and anti-inflammatory medication for 2 weeks. 2) Await pathology results 3) High fiber diet with liberal fluid intake. 4) Repeat  Colonoscopy in 3 years pending pathology review.  [name], MD, [name]  CC:  Jarome Matin, MD  n. Rosalie DoctorVenita Lick. Siomara Burkel at 12/21/2011 03:44 PM  Irven Shelling, [id number]

## 2011-12-21 NOTE — Progress Notes (Signed)
Patient did not experience any of the following events: a burn prior to discharge; a fall within the facility; wrong site/side/patient/procedure/implant event; or a hospital transfer or hospital admission upon discharge from the facility. (G8907) Patient did not have preoperative order for IV antibiotic SSI prophylaxis. (G8918)  

## 2011-12-21 NOTE — Patient Instructions (Addendum)

## 2011-12-22 ENCOUNTER — Telehealth: Payer: Self-pay | Admitting: *Deleted

## 2011-12-22 NOTE — Telephone Encounter (Signed)
  Follow up Call-  Call back number 12/21/2011  Post procedure Call Back phone  # [phone]  Permission to leave phone message Yes     Patient questions:  Do you have a fever, pain , or abdominal swelling? no Pain Score  0 *  Have you tolerated food without any problems? yes  Have you been able to return to your normal activities? yes  Do you have any questions about your discharge instructions: Diet   no Medications  no Follow up visit  no  Do you have questions or concerns about your Care? no  Actions: * If pain score is 4 or above: No action needed, pain <4.  Pt. Stated that he feels great.

## 2011-12-27 ENCOUNTER — Encounter: Payer: Self-pay | Admitting: Gastroenterology

## 2011-12-29 ENCOUNTER — Other Ambulatory Visit: Payer: Self-pay | Admitting: Neurology

## 2011-12-29 ENCOUNTER — Telehealth: Payer: Self-pay | Admitting: Gastroenterology

## 2011-12-29 ENCOUNTER — Ambulatory Visit
Admission: RE | Admit: 2011-12-29 | Discharge: 2011-12-29 | Disposition: A | Discharge status: Home / Self Care | Payer: Managed Care, Other (non HMO) | Source: Ambulatory Visit | Attending: Neurology | Admitting: Neurology

## 2011-12-29 DIAGNOSIS — M79604 Pain in right leg: Secondary | ICD-10-CM

## 2011-12-29 DIAGNOSIS — R609 Edema, unspecified: Secondary | ICD-10-CM

## 2011-12-29 NOTE — Telephone Encounter (Signed)
Patient advised.  He will call back for any questions

## 2011-12-29 NOTE — Telephone Encounter (Signed)
Yes-OK to start Coumadin He should check with Dr. Sandria Manly or his PCP to see if they want him to avoid ASA/NSAIDs while he is on Coumadin

## 2011-12-29 NOTE — Telephone Encounter (Signed)
Patient reports DVT in leg.  Dr. Ileene Rubens is considering coumadin.  The patient had a 15 mm polyps removed 12/21/11 and was instructed to hold NSAIDS for 2 weeks.  The patient wants to know if it would be OK to start coumadin today.  Discussed and reviewed by Dr. Leone Payor ok to start coumadin.  The patient is advised .  I will send a copy of this to Dr. Sandria Manly

## 2012-12-01 ENCOUNTER — Telehealth: Payer: Self-pay | Admitting: Neurology

## 2012-12-01 NOTE — Telephone Encounter (Signed)
Patient's wife paged with a concern regarding patient's balance. He has been off balance since Wednesday. He has fallen and has a bruise on his leg. He is also on Coumadin for blood thinning. He has a history of a stroke and seizures and has been off balance towards the evening hours, typically after 10:30 PM. He takes Dilantin 200 mg in the morning 100 midday and 200 mg at night as well as carbamazepine 400 mg 3 times a day. I advised her that he may have too high levels of either of his antiepileptic medicine. He has not had an INR check in over 4 weeks either. I advised them to proceed to the emergency room to make sure he is not toxic on any of his medication and that his INR is in the therapeutic range. She understood my recommendations and will make a decision after talking with the patient. I advised her that waiting until Monday may not be safe for him especially as he has already fallen one time but thankfully did not hit his head. She understood. He has an appointment next week with Dr. Terrace Arabia, he used to follow with Dr. Sandria Manly.

## 2012-12-05 ENCOUNTER — Telehealth: Payer: Self-pay

## 2012-12-05 NOTE — Telephone Encounter (Signed)
He complains of dizziness, wobbling, I advise him to decrease dilantin to 100mg  i/i/ii,

## 2012-12-05 NOTE — Telephone Encounter (Signed)
Patient calling back about his labs.

## 2012-12-05 NOTE — Telephone Encounter (Signed)
I have called him, Dilantin level was 20.3, tegretol 4.1  Last seizure was Jan 2012.  He is taking dilantin 500mg  /qday, tegretol 200mg  2 tab tid.  No change in treatment now, follow up in May 12th.

## 2012-12-05 NOTE — Telephone Encounter (Signed)
Patient wife calling for labs. Transfer of Care [facility]. Patient has apt. With Dr.Yan 12/10/2012.

## 2012-12-10 ENCOUNTER — Encounter: Payer: Self-pay | Admitting: Neurology

## 2012-12-10 ENCOUNTER — Ambulatory Visit (INDEPENDENT_AMBULATORY_CARE_PROVIDER_SITE_OTHER): Payer: BC Managed Care – PPO | Admitting: Neurology

## 2012-12-10 VITALS — BP 120/67 | HR 63 | Ht 72.5 in | Wt 205.0 lb

## 2012-12-10 DIAGNOSIS — M79609 Pain in unspecified limb: Secondary | ICD-10-CM

## 2012-12-10 DIAGNOSIS — H409 Unspecified glaucoma: Secondary | ICD-10-CM | POA: Insufficient documentation

## 2012-12-10 DIAGNOSIS — E785 Hyperlipidemia, unspecified: Secondary | ICD-10-CM | POA: Insufficient documentation

## 2012-12-10 DIAGNOSIS — K5790 Diverticulosis of intestine, part unspecified, without perforation or abscess without bleeding: Secondary | ICD-10-CM | POA: Insufficient documentation

## 2012-12-10 DIAGNOSIS — G40109 Localization-related (focal) (partial) symptomatic epilepsy and epileptic syndromes with simple partial seizures, not intractable, without status epilepticus: Secondary | ICD-10-CM | POA: Insufficient documentation

## 2012-12-10 DIAGNOSIS — I639 Cerebral infarction, unspecified: Secondary | ICD-10-CM | POA: Insufficient documentation

## 2012-12-10 DIAGNOSIS — I809 Phlebitis and thrombophlebitis of unspecified site: Secondary | ICD-10-CM

## 2012-12-10 DIAGNOSIS — G40209 Localization-related (focal) (partial) symptomatic epilepsy and epileptic syndromes with complex partial seizures, not intractable, without status epilepticus: Secondary | ICD-10-CM

## 2012-12-10 DIAGNOSIS — D689 Coagulation defect, unspecified: Secondary | ICD-10-CM

## 2012-12-10 DIAGNOSIS — G4733 Obstructive sleep apnea (adult) (pediatric): Secondary | ICD-10-CM

## 2012-12-10 DIAGNOSIS — R569 Unspecified convulsions: Secondary | ICD-10-CM

## 2012-12-10 MED ORDER — LACOSAMIDE 100 MG PO TABS: 100.0000 mg | ORAL_TABLET | Freq: Two times a day (BID) | ORAL | Status: DC

## 2012-12-10 NOTE — Patient Instructions (Addendum)
Vimpat 100mg  1/2 tab twice a day xone week, then one tab twice a day.  Dilantin 100mg  2/1/2, now,  1st week, 2/2 2nd week, 1/1 3rd week 0/1 4 week 0/0

## 2012-12-10 NOTE — Progress Notes (Signed)
HPI:  Jason Preston is a 62 year old right-handed white married male with his wife at today visit. He was a patient of Dr. Sandria Manly  He had a stroke in 11/10/2010. At that time he was on his job in [place] and was witnessed in the sitting position, passed out. He had a generalized seizure and was admitted to [facility] for 2 days. CT scan of the brain and MRI study of the brain showed a right brain stroke and he had "another old stroke" on the studies which was not acute.  Doppler study of the carotids, EEG, and blood studies were performed. He had no risk factors for stroke such as hypertension, diabetes, hyperlipidemia, cigarrette use,head trauma, coronary artery disease, or drug use.   He was discharged on seizure precautions and no anticonvulsant medication.  12/27/2010 he was playing tennis and had witnessed episodes of his head jerking to the left. He did not recognize that anything was wrong and insisted on continuing to play.  A pediatrician was playing tennis and witnessed the episodes. He asked the patient to walk over to the shade and sit down.The patient's wife was called. When she arrived his head was jerking to the left having seizure. An ambulance was called.  At that time he was able to walk to the gurney. He had a witnessed generalized major motor seizure x2 and a third at [facility] where he was admitted with the diagnosis of status epilepticus.   He was treated with IV Dilantin. MRI of the brain showed no acute stroke. There was a moderate to large right temporal lobe infarct extending toward the right occipital lobe with encephalomalacia. CT scan of the head showed old infarct in the right temporal lobe with mild extension into the parietal and occipital regions. There was a  small remote infarct in the  left parietal lobe.  Chest x-ray showed cardiomegaly without edema.  The patient was seen by [facility] at 11/29/2010 who reviewed his studies from [facility] with CPKs of  673  to 1133, TSH 0.56, and obtained blood studies with sed rate 4,  ProTime 12.6, INR 0.92, BMP normal, magnesium 2.2, TSH 1.7 and EKG showed sinus arrythymia with small nondiagnostic Q waves.  A portion of the EKG suggested the possibility of low atrial rhythm.  2-D echocardiogram performed showed ejection fraction of 50-55%, atrial septal dimension is normal. No evidence of ASD.No shunt detected by agitated saline injection.Mild tricuspid regurgitation with pulmonary hypertension and RV systolic of 33 mm. Mild aortic sclerosis. A 21 day cardiac monitor showed sinus rhythm with an occasional PAC and short episodes of paroxysmal SVT  being 17 beats of "PAF".   A polysomnogram was performed with mild obstructive sleep apnea and periodic limb movements. Transcranial Dopplers did not reveal evidence of patent foramen ovale or emboli.    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 He had right leg DVT in May 2013, now on brand Coumadin, reported rash with generic warfarin, he was tried on Xarelto, for a while, which was stopped for the reason not clear to me.  in 04/21/11 in this office getting blood drawn, the  patient had 3 left body simple partial seizures and was sent to [facility]. Phenytoin level was 8.0 and his medication was increased. He went to [facility] 08/12/11  had a complex partial seizure, followed by aTodd's paralysis lasting for 24 hours. He was admitted to [facility]. CT scan showed an old right brain stroke and old  left hemisphere stroke.   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 Keppra was discontinued. He was taken home and 08/14/11 was seen at [facility] where he was treated with Ativan for intermittent seizures. MRI showed no evidence of an acute stroke. He was started on carbamepazepine since Jan 2013, with levels of 3.1 and phenytoin 13.5 on 09/06/11 and   09/26/11 of 3.8 and 14.3.  Dr. Sandria Manly has been monitoring his Dilantin, and carbamazepine level and adjusting the dosage of his medication accordingly, patient has misunderstanding of dosage of his medications, he was taking Dilantin 100 mg 2, 1, 2 tablets each day, recent laboratory evaluation showed Dilantin level 20, Tegretol level 8, he complains of dizziness, blurry vision, difficulty reading, concentrating, after discussion with patient and his wife, we decided to tapering him off the Dilantin because of the enzyme inducer, it was difficult to achieve steady medicine level, I will introduce vimpat titrating to 100 mg twice a day, taking along with carbamazepine 200 mg 2 tablets 3 times a day.   Physical Exam  General: well-developed white male.    Neurologic Exam  Mental Status: Alert and oriented to time, place, and person.   . Cranial Nerves: left visual field cut, pupil were equal round and reactive.  Facial symmetric. Tongue midline, uvula midline, and gag present.  Sternocleidomastoid and trapezius testing normal. Motor: 5/5 strength proximally and distally in the upper and lower extremities.  Sensory: Intact to pinprick, light touch, joint position and vibration testing   Coordination: Outstretched hand and arm tremor.  Intact finger-to-nose, heel-to-shin, and rapid alternating movements.    Gait and Station: Can toe walk, heel walk, and tandem gait.    Reflexes: Present and symmetric Plantar responses downgoing.  Assessment and plan:  62 years old right-handed Caucasian male, with past medical history of right hemisphere stroke, complex partial seizure, also factor V Leiden heterozygous, right lower extremity DVT, on chronic Coumadin treatment   1. Tapering down dilantin. 2. Add on vimpat 100mg  bid. 3. RTC in 2 months

## 2013-01-20 ENCOUNTER — Other Ambulatory Visit: Payer: Self-pay

## 2013-01-20 MED ORDER — CARBAMAZEPINE 200 MG PO TABS: 400.0000 mg | ORAL_TABLET | Freq: Three times a day (TID) | ORAL | Status: DC

## 2013-02-11 ENCOUNTER — Ambulatory Visit (INDEPENDENT_AMBULATORY_CARE_PROVIDER_SITE_OTHER): Payer: BC Managed Care – PPO | Admitting: Neurology

## 2013-02-11 ENCOUNTER — Encounter: Payer: Self-pay | Admitting: Neurology

## 2013-02-11 VITALS — BP 136/86 | HR 99 | Ht 72.0 in | Wt 199.0 lb

## 2013-02-11 DIAGNOSIS — R569 Unspecified convulsions: Secondary | ICD-10-CM

## 2013-02-11 NOTE — Progress Notes (Signed)
HPI:  Jason Preston is a 62 year old right-handed white married male with his wife at today visit. He was a patient of Dr. Sandria Manly  He had a stroke in 11/10/2010. At that time he was on his job in [place] and was witnessed in the sitting position, passed out. He had a generalized seizure and was admitted to [facility] for 2 days. CT scan of the brain and MRI study of the brain showed a right brain stroke and he had "another old stroke" on the studies which was not acute.  Doppler study of the carotids, EEG, and blood studies were performed. He had no risk factors for stroke such as hypertension, diabetes, hyperlipidemia, cigarrette use,head trauma, coronary artery disease, or drug use.   He was discharged on seizure precautions and no anticonvulsant medication.  12/27/2010 he was playing tennis and had witnessed episodes of his head jerking to the left. He did not recognize that anything was wrong and insisted on continuing to play.  A pediatrician was playing tennis and witnessed the episodes. He asked the patient to walk over to the shade and sit down.The patient's wife was called. When she arrived his head was jerking to the left having seizure. An ambulance was called.  At that time he was able to walk to the gurney. He had a witnessed generalized major motor seizure x2 and a third at [facility] where he was admitted with the diagnosis of status epilepticus.   He was treated with IV Dilantin. MRI of the brain showed no acute stroke. There was a moderate to large right temporal lobe infarct extending toward the right occipital lobe with encephalomalacia. CT scan of the head showed old infarct in the right temporal lobe with mild extension into the parietal and occipital regions. There was a  small remote infarct in the  left parietal lobe.  Chest x-ray showed cardiomegaly without edema.  The patient was seen by [facility] at 11/29/2010 who reviewed his studies from [facility] with CPKs of  673  to 1133, TSH 0.56, and obtained blood studies with sed rate 4,  ProTime 12.6, INR 0.92, BMP normal, magnesium 2.2, TSH 1.7 and EKG showed sinus arrythymia with small nondiagnostic Q waves.  A portion of the EKG suggested the possibility of low atrial rhythm.  2-D echocardiogram performed showed ejection fraction of 50-55%, atrial septal dimension is normal. No evidence of ASD.No shunt detected by agitated saline injection.Mild tricuspid regurgitation with pulmonary hypertension and RV systolic of 33 mm. Mild aortic sclerosis. A 21 day cardiac monitor showed sinus rhythm with an occasional PAC and short episodes of paroxysmal SVT  being 17 beats of "PAF".   A polysomnogram was performed with mild obstructive sleep apnea and periodic limb movements. Transcranial Dopplers did not reveal evidence of patent foramen ovale or emboli.    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 He had right leg DVT in May 2013, now on brand Coumadin, reported rash with generic warfarin, he was tried on Xarelto, for a while, which was stopped for the reason not clear to me.  in 04/21/11 in this office getting blood drawn, the  patient had 3 left body simple partial seizures and was sent to [facility]. Phenytoin level was 8.0 and his medication was increased. He went to [facility] 08/12/11  had a complex partial seizure, followed by aTodd's paralysis lasting for 24 hours. He was admitted to [facility]. CT scan showed an old right brain stroke and old  left hemisphere stroke.   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 Keppra was discontinued. He was taken home and 08/14/11 was seen at [facility] where he was treated with Ativan for intermittent seizures. MRI showed no evidence of an acute stroke. He was started on carbamepazepine since Jan 2013, with levels of 3.1 and phenytoin 13.5 on 09/06/11 and   09/26/11 of 3.8 and 14.3.  Dr. Sandria Manly has been monitoring his Dilantin, and carbamazepine level and adjusting the dosage of his medication accordingly, patient has misunderstanding of dosage of his medications, he was taking Dilantin 100 mg 2, 1, 2 tablets each day, recent laboratory evaluation showed Dilantin level 20, Tegretol level 8, he complains of dizziness, blurry vision, difficulty reading, concentrating, after discussion with patient and his wife, we decided to tapering him off the Dilantin because of the enzyme inducer, it was difficult to achieve steady medicine level, I will introduce vimpat titrating to 100 mg twice a day, taking along with carbamazepine 200 mg 2 tablets 3 times a day.  UPDATE July 14th 2014: He likes the change of vimpat 100mg  bid, along with carbamazepine 200mg  2 tabs tid, he has mildly elevated bp, no recurrent seizure,  His dizziness has improved, he has marital stress.   Physical Exam:  General: well-developed white male.    Neurologic Exam  Mental Status: Alert and oriented to time, place, and person.   . Cranial Nerves: left visual field cut, pupil were equal round and reactive.  Facial symmetric. Tongue midline, uvula midline, and gag present.  Sternocleidomastoid and trapezius testing normal. Motor: 5/5 strength proximally and distally in the upper and lower extremities.  Sensory: Intact to pinprick, light touch, joint position and vibration testing   Coordination: Outstretched hand and arm tremor.  Intact finger-to-nose, heel-to-shin, and rapid alternating movements.    Gait and Station: Can toe walk, heel walk, and tandem gait.    Reflexes: Present and symmetric Plantar responses downgoing.  Assessment and plan:  62 years old right-handed Caucasian male, with past medical history of right hemisphere stroke, complex partial seizure, also factor V Leiden heterozygous, right lower extremity DVT, on chronic Coumadin treatment   1. Keep vimpat 100mg  bid,  tegretol 200mg  2 tabs tid. 2. RTC in 12 months

## 2013-06-16 ENCOUNTER — Other Ambulatory Visit: Payer: Self-pay | Admitting: Neurology

## 2013-06-17 ENCOUNTER — Other Ambulatory Visit: Payer: Self-pay | Admitting: Neurology

## 2013-06-17 NOTE — Telephone Encounter (Signed)
Pt's prescription was faxed over to [facility] at [phone].

## 2013-06-24 IMAGING — US US EXTREM LOW VENOUS*R*
1 series · 14 of 24 positions shown · non-contrast
Comparison: None

CLINICAL DATA: Pain and swelling.  History of DVT.

RIGHT LOWER EXTREMITY VENOUS DOPPLER ULTRASOUND
TECHNIQUE: Gray-scale sonography with compression, as well as color
and duplex ultrasound, were performed to evaluate the deep venous
system from the level of the common femoral vein through the
popliteal and proximal calf veins.

[Series 1: us extrem low venous*right* · 14 of 42 slices shown]
[im 1/42]
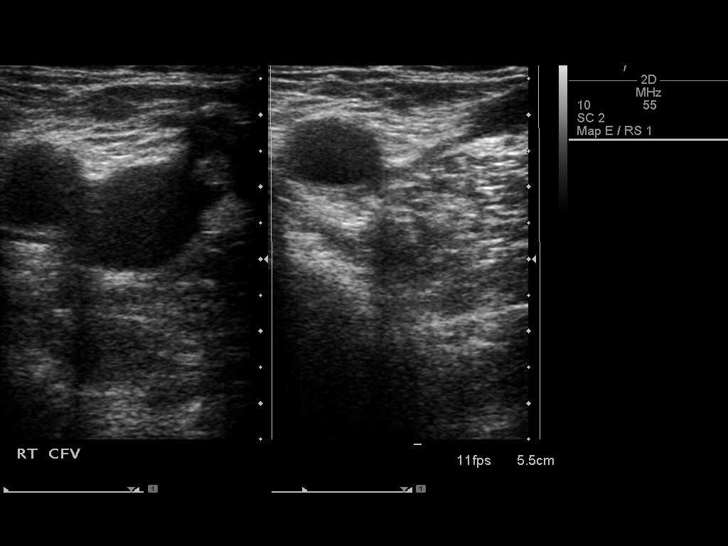
[im 4/42]
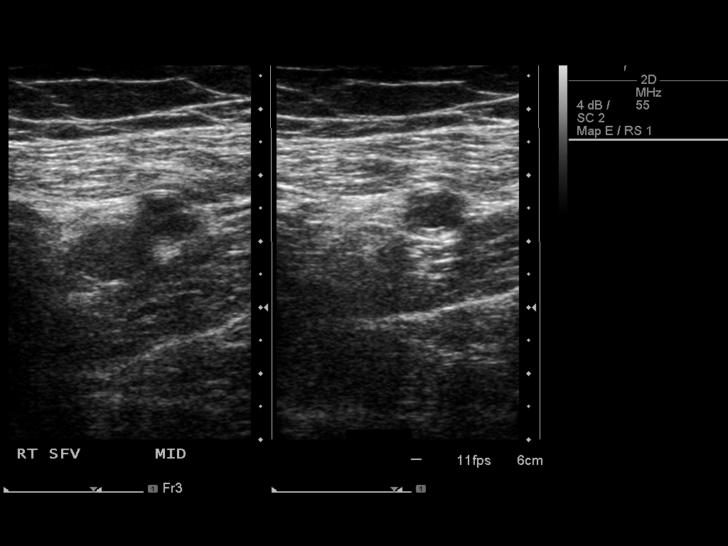
[im 8/42]
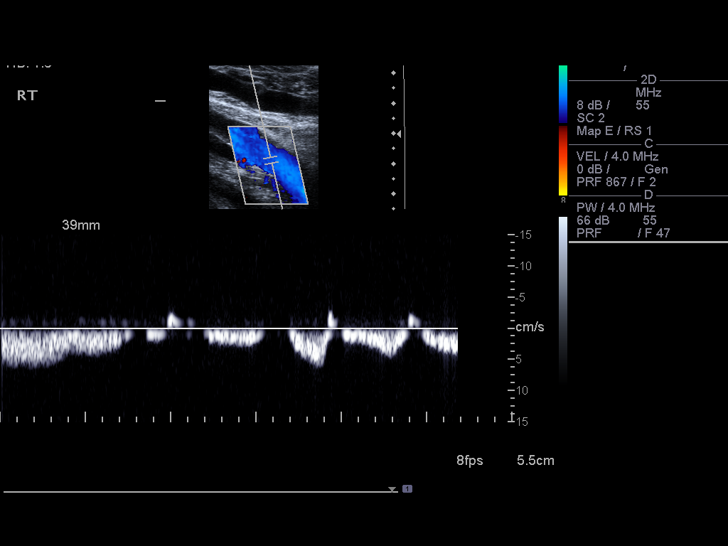
[im 11/42]
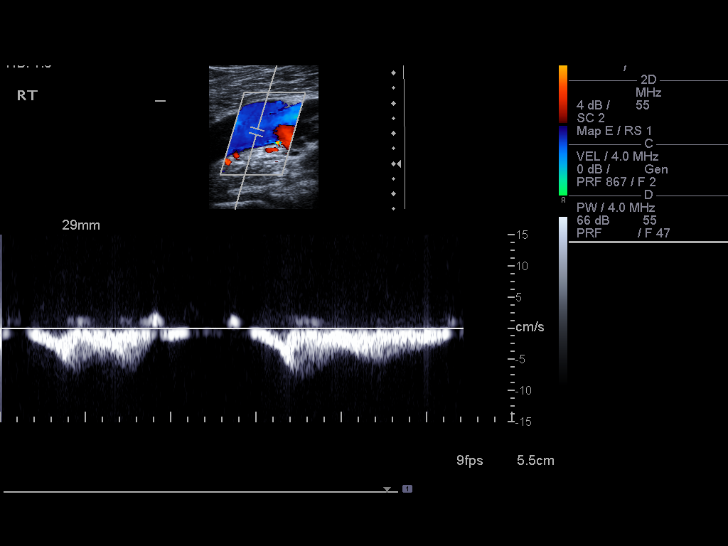
[im 13/42]
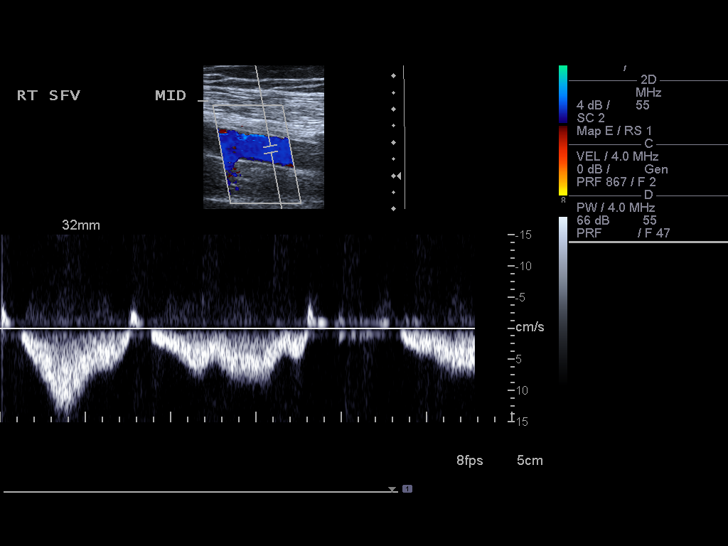
[im 17/42]
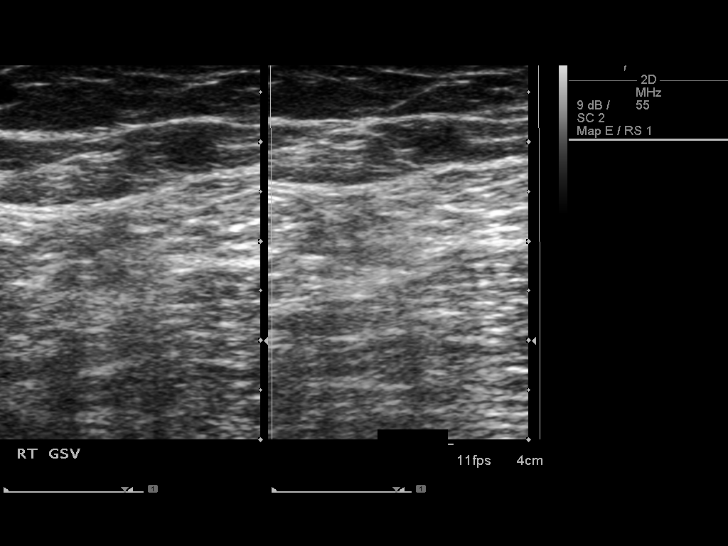
[im 20/42]
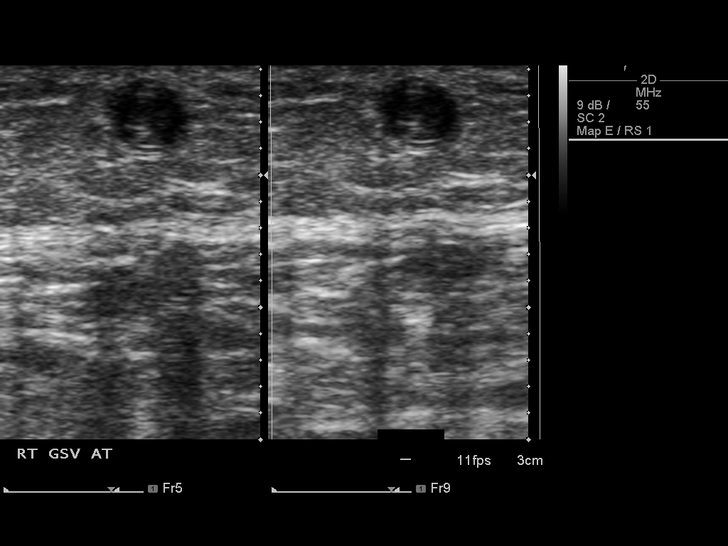
[im 22/42]
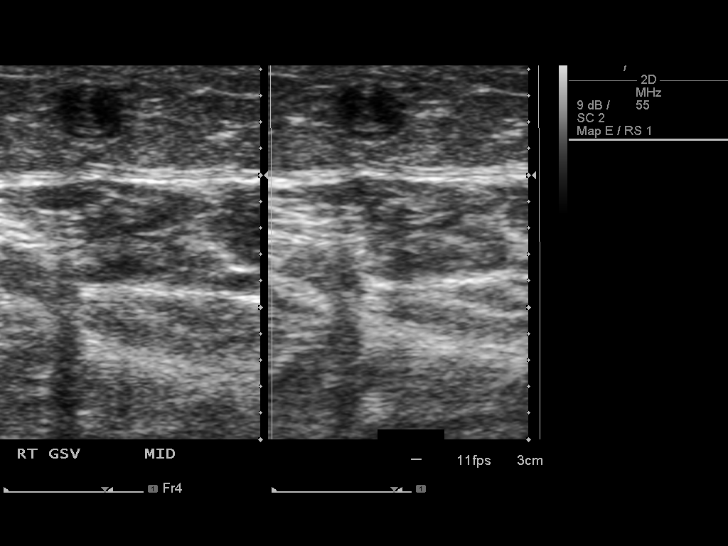
[im 25/42]
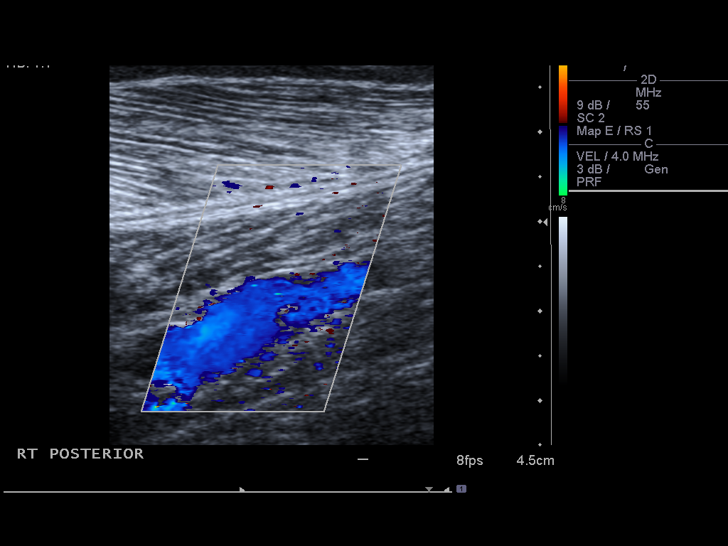
[im 29/42]
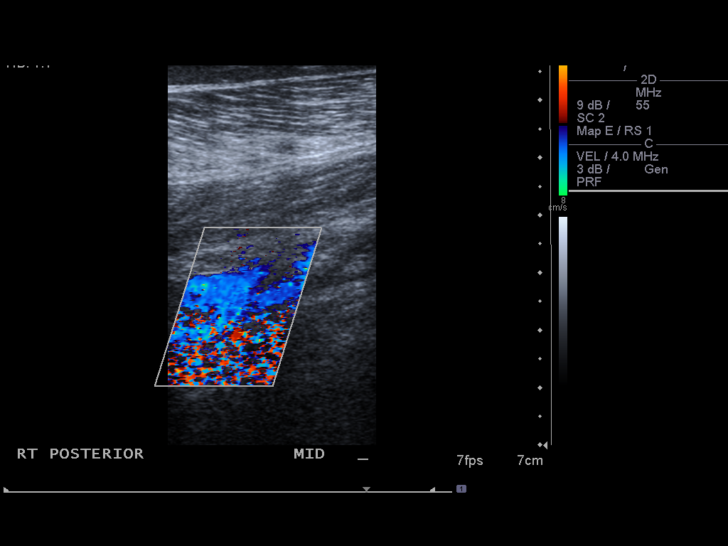
[im 33/42]
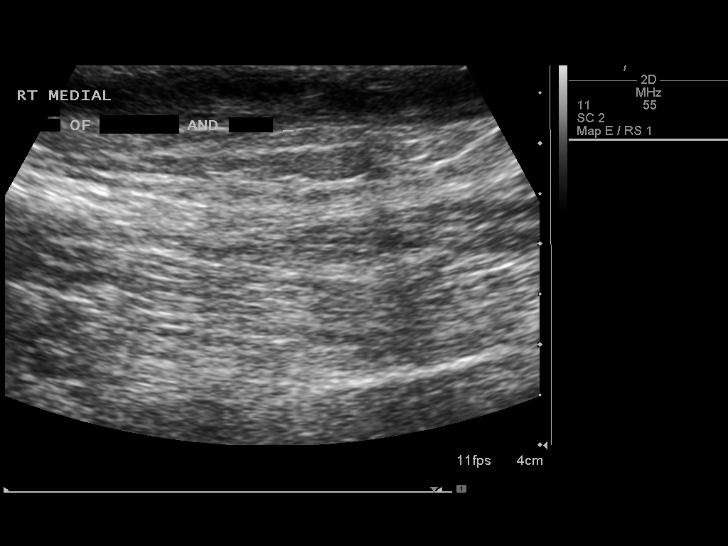
[im 34/42]
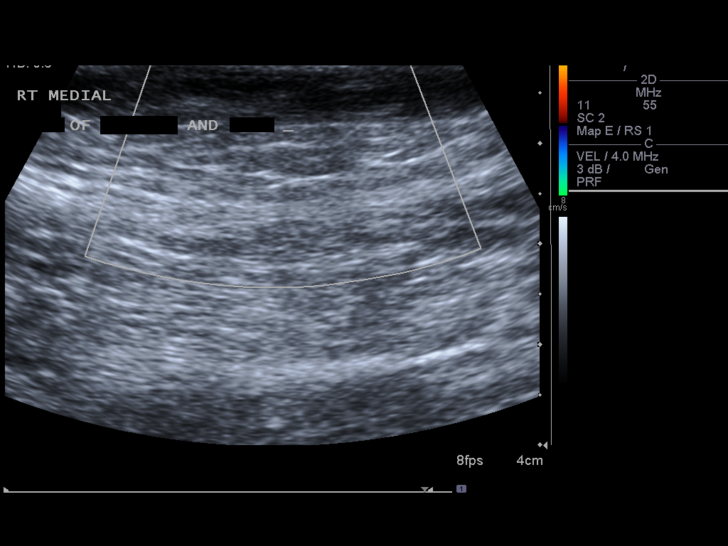
[im 38/42]
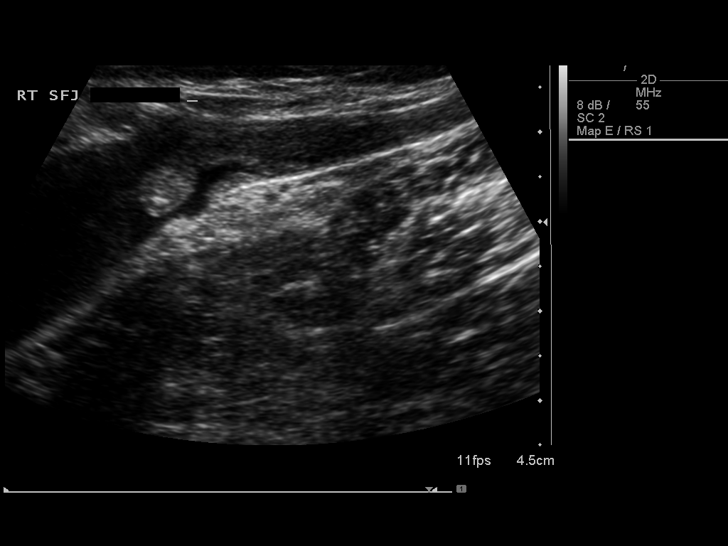
[im 42/42]
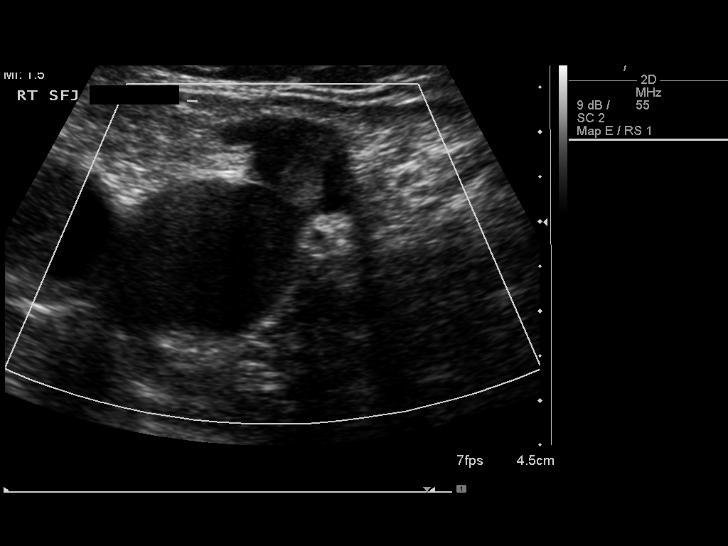

[14 of 24 positions shown; findings below may reference images not displayed]

FINDINGS: There is partially occlusive noncompressible luminal
thrombus in the popliteal vein.  Superficial femoral and common
femoral veins widely patent.  There is thrombosis and
noncompressibility of the greater saphenous vein from the lower
calf to the saphenofemoral junction. Visualized posterior tibial
veins are patent and compressible.
IMPRESSION: 1. Partially occlusive right popliteal DVT.
2.  Long segment thrombosis of the greater saphenous vein
(superficial thrombophlebitis).

## 2013-11-14 ENCOUNTER — Other Ambulatory Visit: Payer: Self-pay | Admitting: Neurology

## 2013-12-16 ENCOUNTER — Other Ambulatory Visit: Payer: Self-pay | Admitting: Neurology

## 2014-01-18 ENCOUNTER — Telehealth: Payer: Self-pay | Admitting: Neurology

## 2014-01-18 NOTE — Telephone Encounter (Signed)
Patient called over the weekend to oncall physician Dr. Brett Fairy about his medications, multiple attempts to call his home and cell, fail to reach him,  Janett Billow, please call him to check on his medication questions.

## 2014-01-20 NOTE — Telephone Encounter (Signed)
I called the patient at home, not available.  Called cell, got no answer.  Left message.

## 2014-01-21 NOTE — Telephone Encounter (Signed)
I called again.  Got no answer.  Left message asking patient to call us back if assistance is still needed.

## 2014-02-10 ENCOUNTER — Ambulatory Visit: Payer: BC Managed Care – PPO | Admitting: Nurse Practitioner

## 2014-11-04 ENCOUNTER — Encounter: Payer: Self-pay | Admitting: Gastroenterology
